# Patient Record
Sex: Female | Born: 2000 | Hispanic: No | Marital: Single | Smoking: Current every day smoker
Health system: Southern US, Community
[De-identification: ages and names within clinical notes are randomized; demographics above are authoritative.]

## PROBLEM LIST (undated history)

## (undated) DIAGNOSIS — Z789 Other specified health status: Secondary | ICD-10-CM

## (undated) HISTORY — DX: Other specified health status: Z78.9

## (undated) HISTORY — PX: NO PAST SURGERIES: SHX2092

---
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
## 2021-01-28 IMAGING — US US MFM OB DETAIL +14 WK
1 series · 13 of 28 positions shown · non-contrast
Comparison: none

[Series 1: us mfm ob detail +14 wk · 100 acquisitions, 13 frames shown]
[im 4/100]
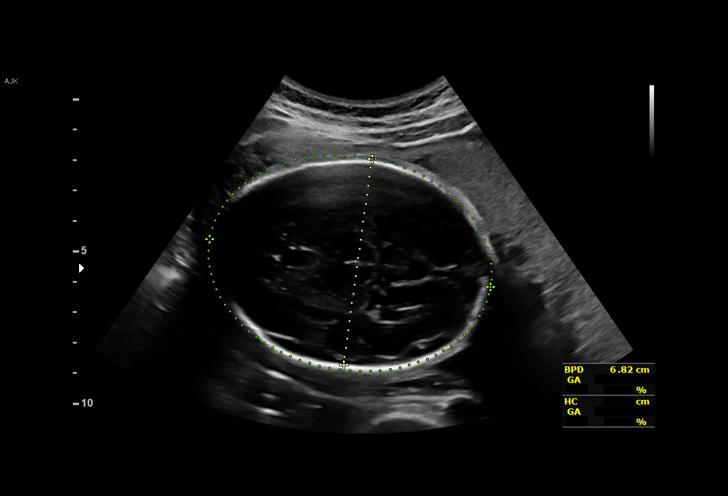
[im 12/100]
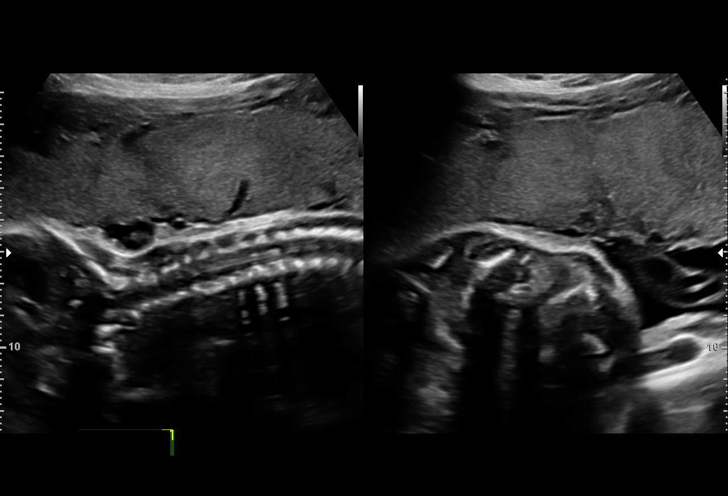
[im 19/100]
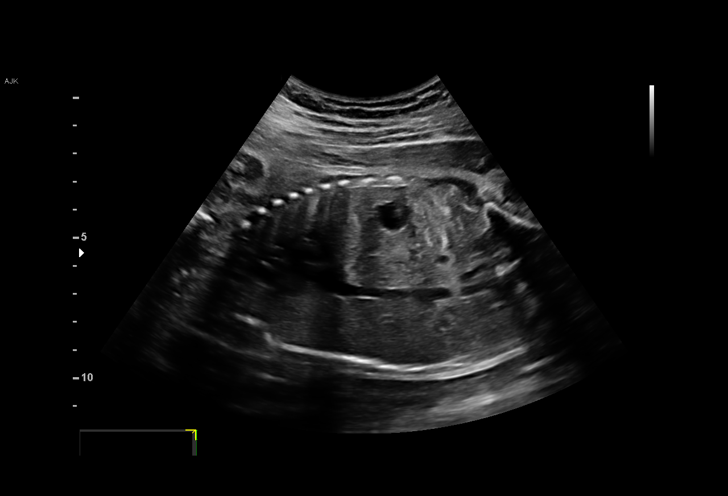
[im 26/100]
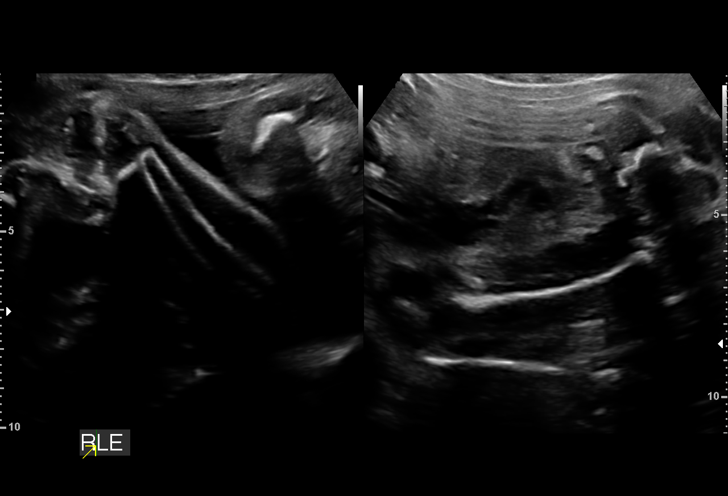
[im 34/100]
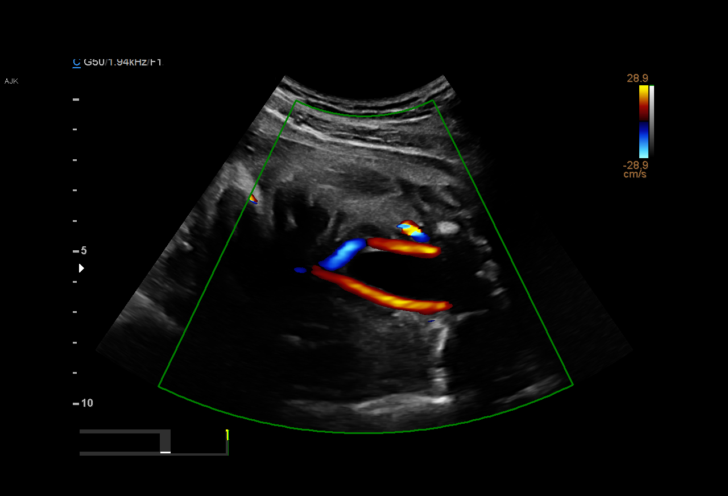
[im 41/100]
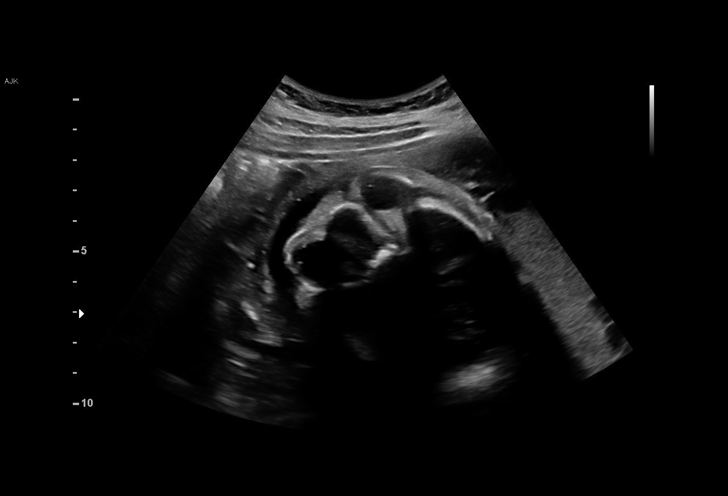
[im 52/100]
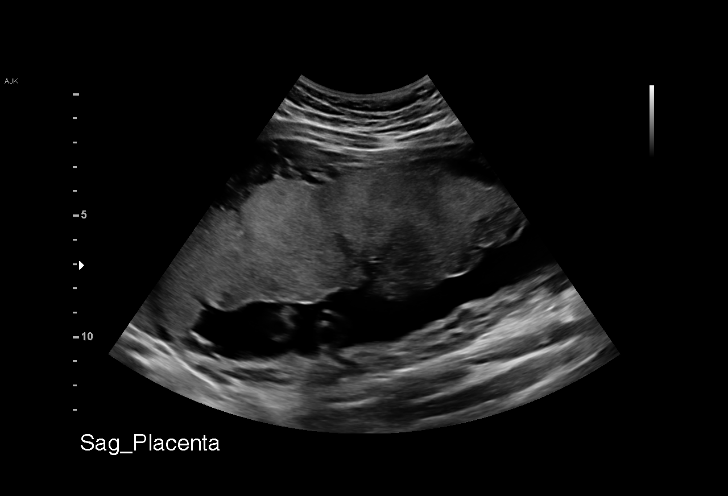
[im 59/100]
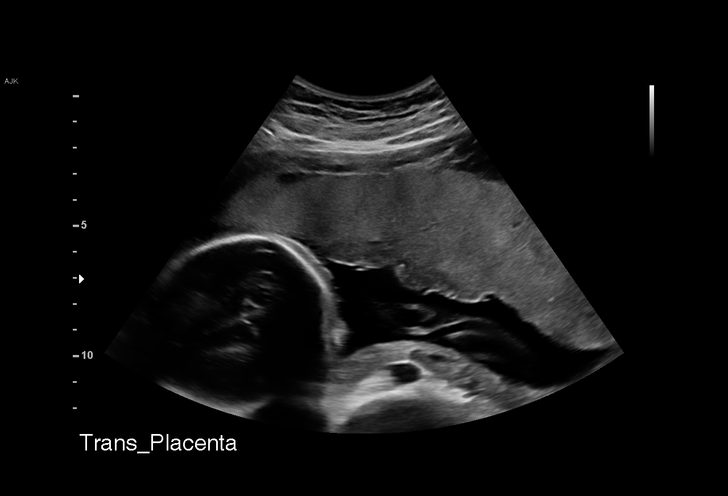
[im 67/100]
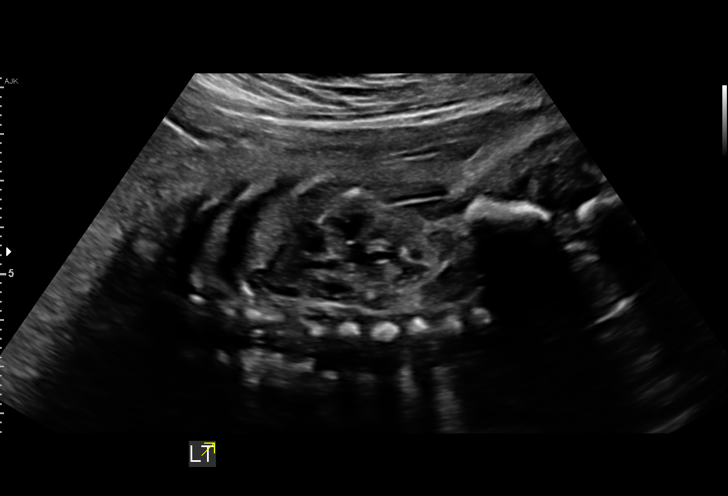
[im 74/100]
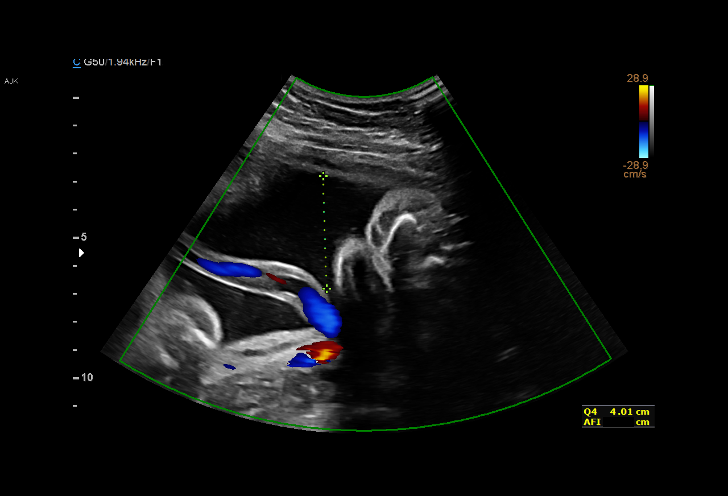
[im 81/100]
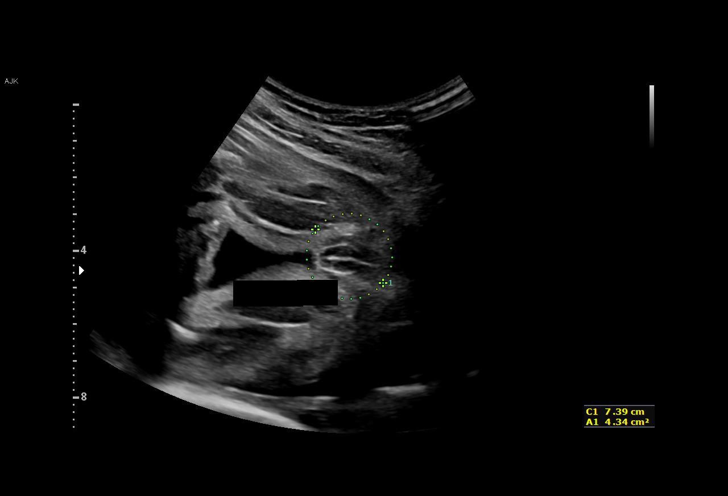
[im 89/100]
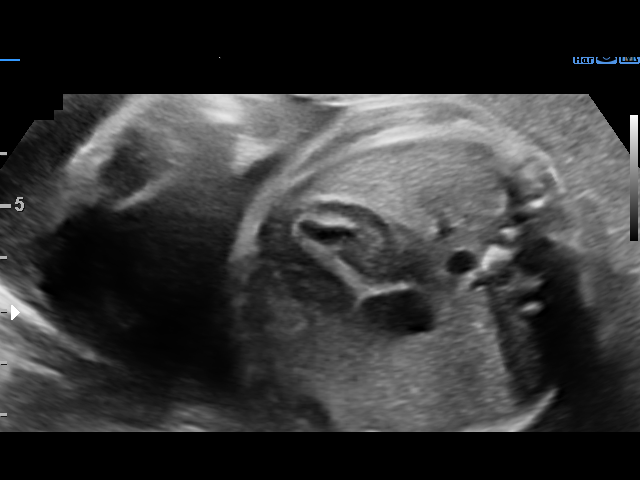
[im 96/100]
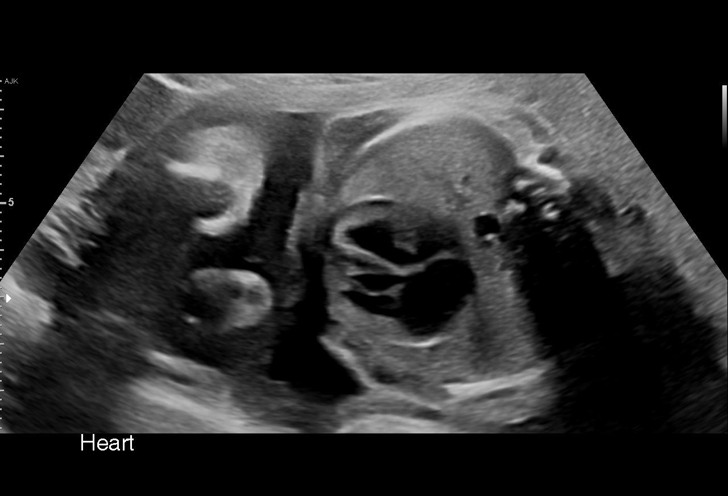

[13 of 28 positions shown; findings below may reference images not displayed]

[address]

 ----------------------------------------------------------------------

 ----------------------------------------------------------------------
Indications

  Encounter for antenatal screening for
  malformations
  Late to prenatal care, third trimester
  Teen pregnancy
  28 weeks gestation of pregnancy
 ----------------------------------------------------------------------
Vital Signs

 BMI:
Fetal Evaluation

 Num Of Fetuses:          1
 Fetal Heart Rate(bpm):   145
 Cardiac Activity:        Observed
 Presentation:            Breech
 Placenta:                Anterior
 P. Cord Insertion:       Visualized

 Amniotic Fluid
 AFI FV:      Within normal limits

 AFI Sum(cm)     %Tile       Largest Pocket(cm)
 13.1            37

 RUQ(cm)       RLQ(cm)       LUQ(cm)        LLQ(cm)

Biometry

 BPD:      67.8  mm     G. Age:  27w 2d          9  %    CI:        70.38   %    70 - 86
                                                         FL/HC:       21.1  %    18.8 -
 HC:      257.7  mm     G. Age:  28w 0d         11  %    HC/AC:       1.06       1.05 -
 AC:      242.7  mm     G. Age:  28w 4d         46  %    FL/BPD:      80.2  %    71 - 87
 FL:       54.4  mm     G. Age:  28w 5d         45  %    FL/AC:       22.4  %    20 - 24
 HUM:        48  mm     G. Age:  28w 1d         40  %
 CER:      32.5  mm     G. Age:  28w 4d         51  %

 LV:        3.7  mm
 CM:        7.1  mm

 Est. FW:    9011   gm   2 lb 11 oz      38  %
OB History

 Gravidity:    1
Gestational Age

 LMP:           28w 3d        Date:  12/05/18                 EDD:   09/11/19
 U/S Today:     28w 1d                                        EDD:   09/13/19
 Best:          28w 3d     Det. By:  LMP  (12/05/18)          EDD:   09/11/19
Anatomy

 Cranium:               Appears normal         Aortic Arch:            Appears normal
 Cavum:                 Appears normal         Ductal Arch:            Appears normal
 Ventricles:            Appears normal         Diaphragm:              Appears normal
 Choroid Plexus:        Appears normal         Stomach:                Appears normal, left
                                                                       sided
 Cerebellum:            Appears normal         Abdomen:                Appears normal
 Posterior Fossa:       Appears normal         Abdominal Wall:         Appears nml (cord
                                                                       insert, abd wall)
 Nuchal Fold:           Not applicable (>20    Cord Vessels:           Appears normal (3
                        wks GA)                                        vessel cord)
 Face:                  Appears normal         Kidneys:                Appear normal
                        (orbits and profile)
 Lips:                  Appears normal         Bladder:                Appears normal
 Thoracic:              Appears normal         Spine:                  Appears normal
 Heart:                 Appears normal         Upper Extremities:      Appears normal
                        (4CH, axis, and
                        situs)
 RVOT:                  Appears normal         Lower Extremities:      Appears normal
 LVOT:                  Appears normal

 Other:  Nasal bone visualized. Fetus appears to be female. Heels visualized.
         Technically difficult due to fetal position.
Cervix Uterus Adnexa

 Cervix
 Length:           2.79  cm.
 Normal appearance by transabdominal scan.

 Uterus
 No abnormality visualized.

 Left Ovary
 Not visualized.

 Right Ovary
 Not visualized.

 Adnexa
 No abnormality visualized.
Impression

 Normal interval growth.  No ultrasonic evidence of structural
 fetal anomalies.
Recommendations

 Follow up growth as clincally indicated.

## 2021-04-19 IMAGING — US US FETAL BPP W/ NON-STRESS
1 series · 11 of 11 positions shown · non-contrast
Comparison: none

[Series 1: us fetal bpp w/nonstress · 11 acquisitions, 11 frames shown]
[im 1/11]
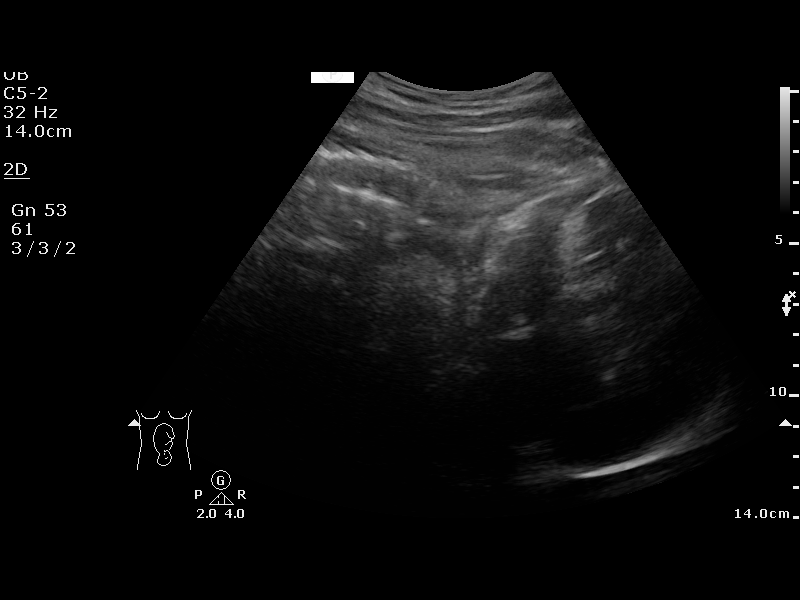
[im 2/11]
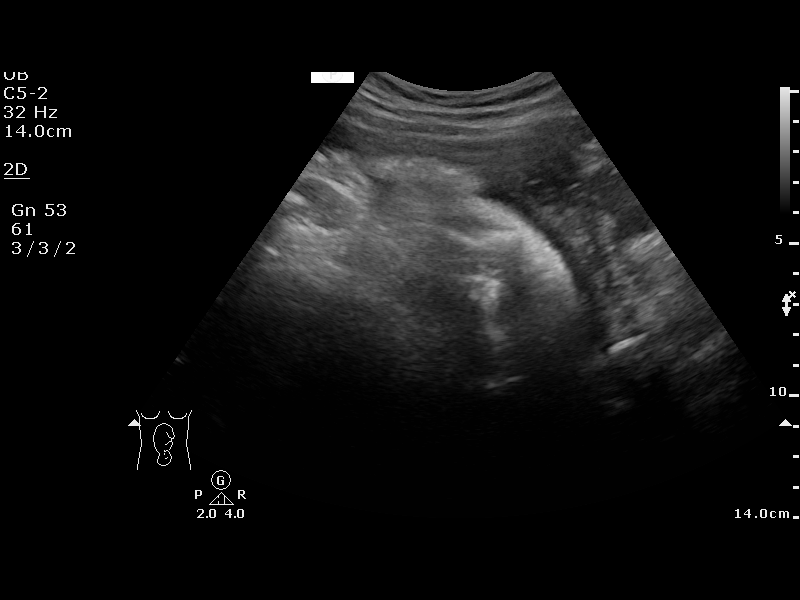
[im 3/11]
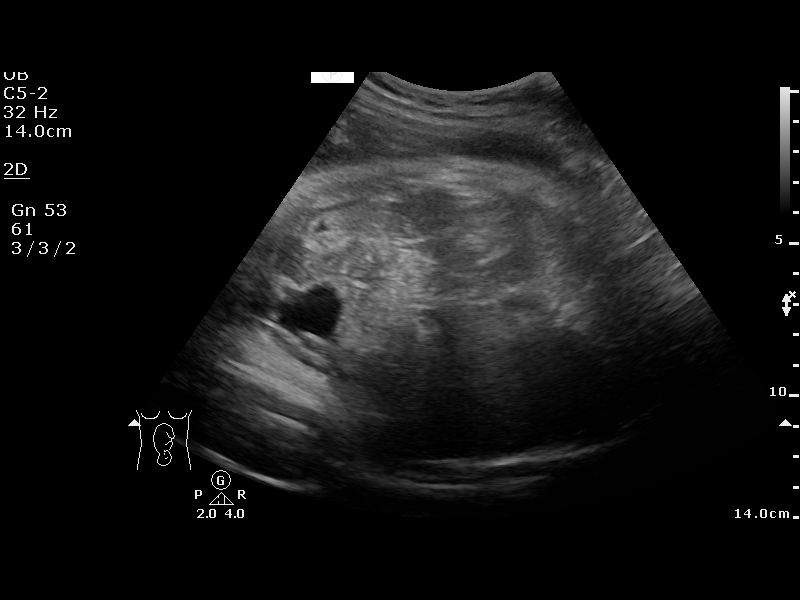
[im 4/11]
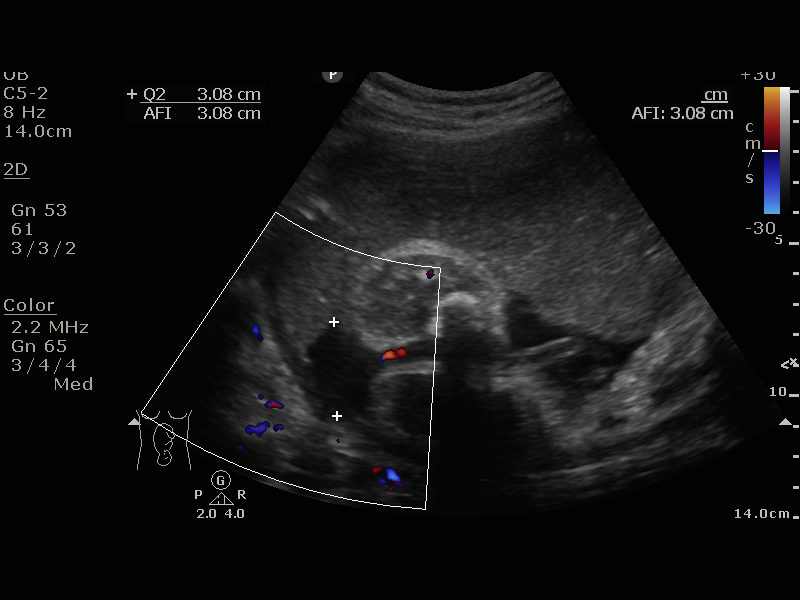
[im 5/11]
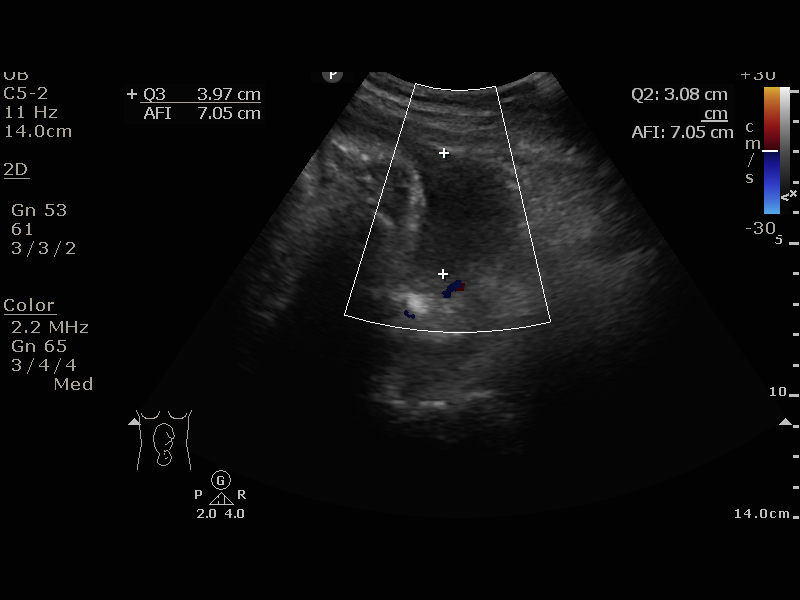
[im 6/11]
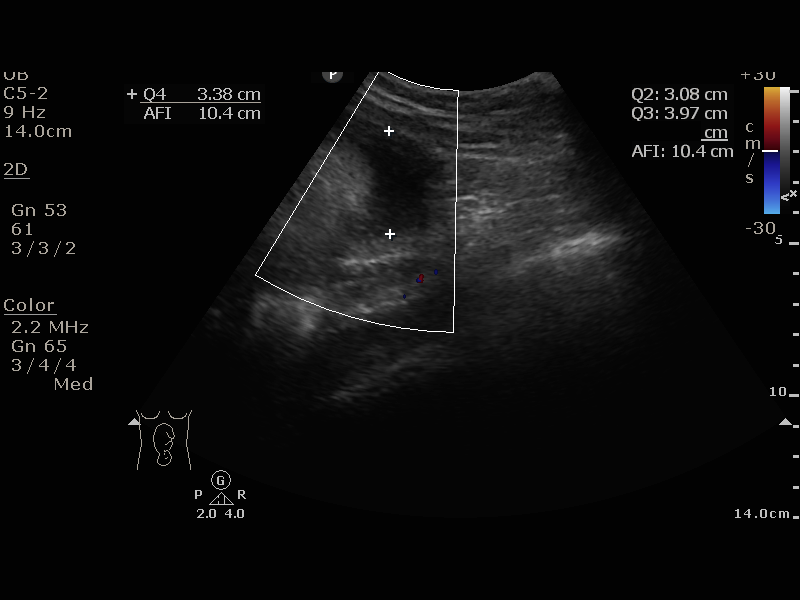
[im 7/11]
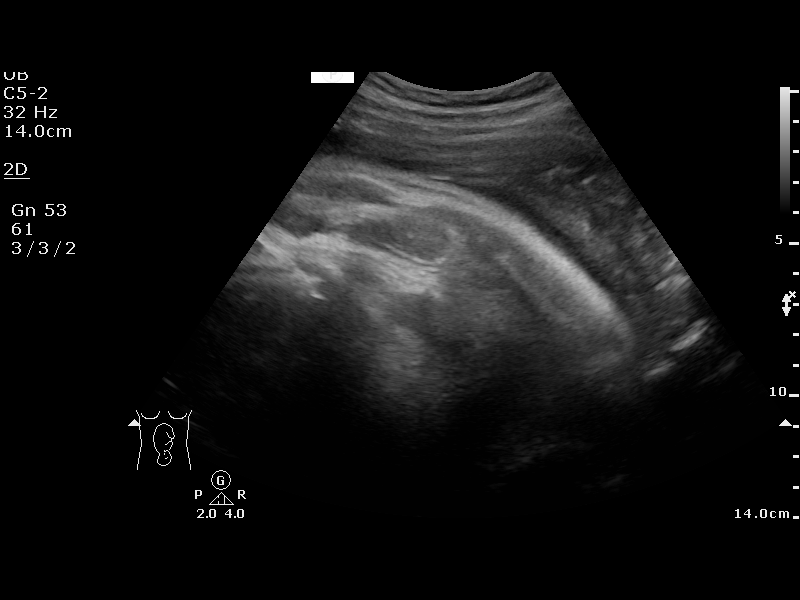
[im 8/11]
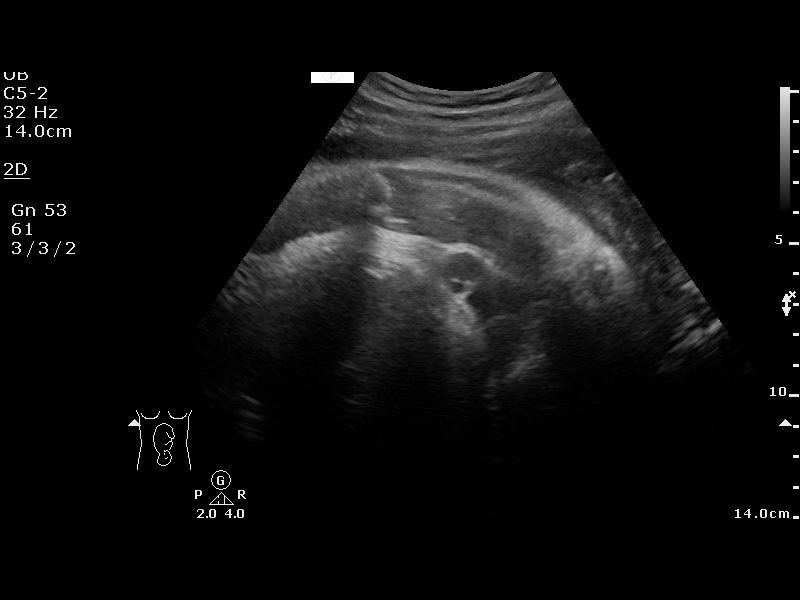
[im 9/11]
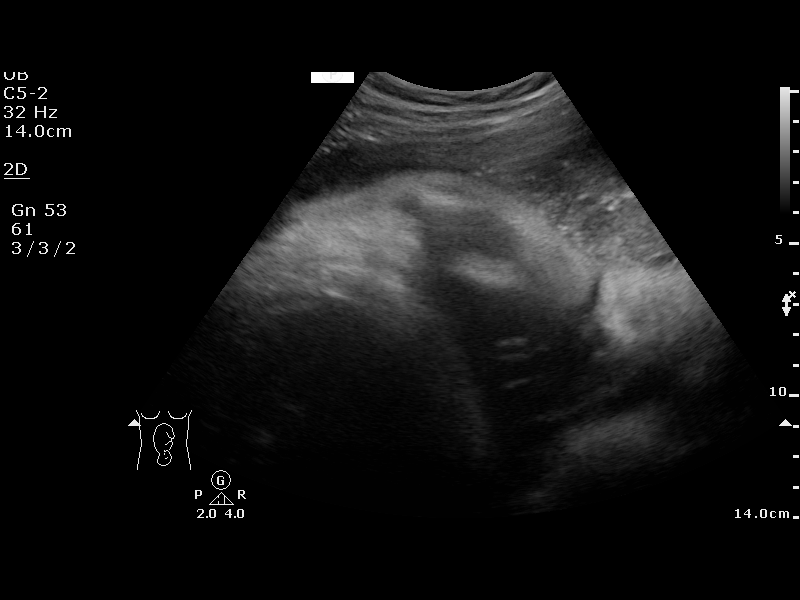
[im 10/11]
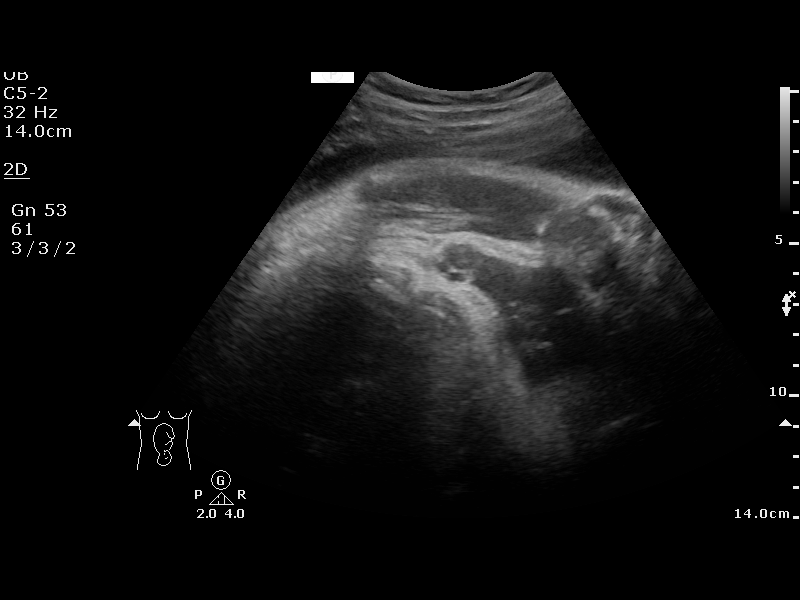
[im 11/11]
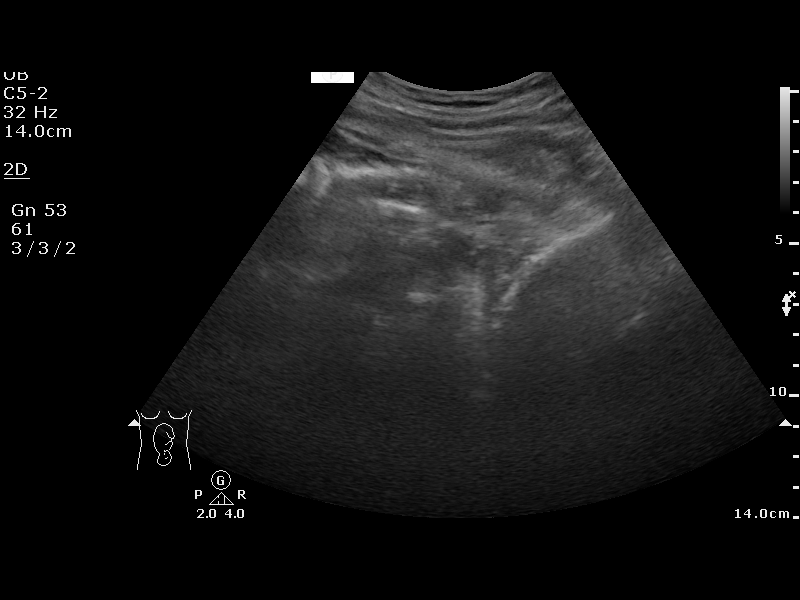

[11 of 11 positions shown; findings below may reference images not displayed]

[address]
                                                            Women's
                                                            [REDACTED]

 ----------------------------------------------------------------------

 ----------------------------------------------------------------------
Service(s) Provided

 ----------------------------------------------------------------------
Indications

  40 weeks gestation of pregnancy
  Postdate pregnancy (40-42 weeks)
 ----------------------------------------------------------------------
Vital Signs

                                                Height:        5'7"
Fetal Evaluation

 Num Of Fetuses:         1
 Preg. Location:         Intrauterine
 Cardiac Activity:       Observed
 Presentation:           Cephalic

 Amniotic Fluid
 AFI FV:      Within normal limits

 AFI Sum(cm)     %Tile       Largest Pocket(cm)
 10.43           34

 RUQ(cm)       RLQ(cm)       LUQ(cm)        LLQ(cm)
 0
Biophysical Evaluation

 Amniotic F.V:   Pocket => 2 cm             F. Tone:        Observed
 F. Movement:    Observed                   N.S.T:          Reactive
 F. Breathing:   Not Observed               Score:          [DATE]
OB History

 Gravidity:    1
Gestational Age

 LMP:           40w 0d        Date:  12/05/18                 EDD:   09/11/19
 Best:          40w 0d     Det. By:  LMP  (12/05/18)          EDD:   09/11/19
Impression

 Antenatal testing is reassuring with BPP [DATE]. Normal
 amniotic fluid volume.
Recommendations

 Continue weekly antenatal testing till delivery.
              Forney, Kossi

## 2023-10-31 ENCOUNTER — Emergency Department (HOSPITAL_COMMUNITY)
Admission: EM | Admit: 2023-10-31 | Discharge: 2023-10-31 | Disposition: A | Discharge status: Home / Self Care | Payer: Commercial Managed Care - HMO | Attending: Emergency Medicine | Admitting: Emergency Medicine

## 2023-10-31 ENCOUNTER — Other Ambulatory Visit: Payer: Self-pay

## 2023-10-31 ENCOUNTER — Encounter (HOSPITAL_COMMUNITY): Payer: Self-pay

## 2023-10-31 DIAGNOSIS — K625 Hemorrhage of anus and rectum: Secondary | ICD-10-CM | POA: Diagnosis present

## 2023-10-31 NOTE — Discharge Instructions (Signed)
Contact a health care provider if: You have pain or tenderness in your abdomen. You have a fever. You feel weak or nauseous. You cannot poop. You have new or more rectal bleeding. You have black or dark red poop. You vomit, and the vomit has blood or something that looks like coffee grounds in it. Get help right away if: You faint. You have severe pain in your rectum. These symptoms may be an emergency. Get help right away. Call 911. Do not wait to see if the symptoms will go away. Do not drive yourself to the hospital.

## 2023-10-31 NOTE — ED Triage Notes (Signed)
Patient is here for evaluation after removing a butt plug from her rectum. Reports that while she was trying to remove it she noticed a large amount of blood and wanted to make sure she did not rupture anything. No bleeding at this time.

## 2023-10-31 NOTE — ED Provider Notes (Signed)
[place] EMERGENCY DEPARTMENT AT [facility] Provider Note   CSN: [id number] Arrival date & time: 10/31/23  1439     History  Chief Complaint  Patient presents with   Rectal Bleeding    Victoria Gentry is a 22 y.o. female.  Presents emergency department for evaluation of rectal bleeding.  Patient reports she had a butt plug in her rectum this morning.  She took it out about 11:00 and when she removed it she passed a large clot from her bottom.  She reports that she has not made a bowel movement since.  She took a shower and then came directly here for evaluation.  She denies any pain in her anus.  She reports that he has not noticed any bleeding since that time.  She is never had anything like this before.  She reports her pain to be about a 4 out of 10 but states "pretty high pain tolerance."   Rectal Bleeding      Home Medications Prior to Admission medications   Medication Sig Start Date End Date Taking? Authorizing Provider  ibuprofen (ADVIL) 600 MG tablet Take 1 tablet (600 mg total) by mouth every 6 (six) hours. 09/15/19   Arvilla Market, MD  Prenatal Vit w/Fe-Methylfol-FA (PNV PO) Take by mouth.    [provider]      Allergies    Patient has no known allergies.    Review of Systems   Review of Systems  Gastrointestinal:  Positive for hematochezia.    Physical Exam Updated Vital Signs BP 122/68   Pulse 99   Temp 98 F (36.7 C)   Resp 16   Ht 5\' 7"  (1.702 m)   Wt 65.9 kg   LMP 10/06/2023   SpO2 100%   BMI 22.75 kg/m  Physical Exam Vitals and nursing note reviewed.  Constitutional:      General: She is not in acute distress.    Appearance: She is well-developed. She is not diaphoretic.  HENT:     Head: Normocephalic and atraumatic.     Right Ear: External ear normal.     Left Ear: External ear normal.     Nose: Nose normal.     Mouth/Throat:     Mouth: Mucous membranes are moist.  Eyes:     General: No scleral  icterus.    Conjunctiva/sclera: Conjunctivae normal.  Cardiovascular:     Rate and Rhythm: Normal rate and regular rhythm.     Heart sounds: Normal heart sounds. No murmur heard.    No friction rub. No gallop.  Pulmonary:     Effort: Pulmonary effort is normal. No respiratory distress.     Breath sounds: Normal breath sounds.  Abdominal:     General: Bowel sounds are normal. There is no distension.     Palpations: Abdomen is soft. There is no mass.     Tenderness: There is no abdominal tenderness. There is no guarding.  Genitourinary:    Comments: Patient has normal rectal Vear, no thrombosed external hemorrhoids.  No fissures.  Minimal tenderness with digital rectal examination, no blood noted on examining glove. Musculoskeletal:     Cervical back: Normal range of motion.  Skin:    General: Skin is warm and dry.  Neurological:     Mental Status: She is alert and oriented to person, place, and time.  Psychiatric:        Behavior: Behavior normal.     ED Results / Procedures / Treatments  Labs (all labs ordered are listed, but only abnormal results are displayed) Labs Reviewed - No data to display  EKG None  Radiology No results found.  Procedures Procedures    Medications Ordered in ED Medications - No data to display  ED Course/ Medical Decision Making/ A&P                                 Medical Decision Making  Patient here with some rectal bleeding after using a butt plug this morning.  She has no active bleeding at this time.  No evidence of fissures.  She has minimal pain.  Suggest over-the-counter lidocaine hemorrhoid cream for comfort avoiding placing anything in the anus or rectum and observation for necessity to return.  Patient does not have any evidence of infections.  She does not have any active bleeding.  Patient advised to return should she have evidence of infection including discharge, foul odor, severe pain out of proportion, fevers or chills  and/or signs or symptoms of hemorrhage including passing large volume clots with bowel movements, weakness, racing heart, lightheadedness or near syncope/syncope.  Is okay with plan for discharge at this time.        Final Clinical Impression(s) / ED Diagnoses Final diagnoses:  Rectal bleeding    Rx / DC Orders ED Discharge Orders     None         Arthor Captain, PA-C 10/31/23 1623    Wynetta Fines, MD 10/31/23 2153

## 2024-04-25 ENCOUNTER — Encounter (HOSPITAL_COMMUNITY): Payer: Self-pay
